# Patient Record
Sex: Male | Born: 1995 | Hispanic: Yes | Marital: Single
Health system: Southern US, Community
[De-identification: ages and names within clinical notes are randomized; demographics above are authoritative.]

---

## 2021-05-29 ENCOUNTER — Emergency Department (HOSPITAL_COMMUNITY): Payer: No Typology Code available for payment source

## 2021-05-29 ENCOUNTER — Emergency Department (HOSPITAL_COMMUNITY)
Admission: EM | Admit: 2021-05-29 | Discharge: 2021-05-29 | Disposition: A | Discharge status: Home / Self Care | Payer: No Typology Code available for payment source | Attending: Emergency Medicine | Admitting: Emergency Medicine

## 2021-05-29 DIAGNOSIS — M25562 Pain in left knee: Secondary | ICD-10-CM | POA: Diagnosis not present

## 2021-05-29 DIAGNOSIS — Y92410 Unspecified street and highway as the place of occurrence of the external cause: Secondary | ICD-10-CM | POA: Insufficient documentation

## 2021-05-29 DIAGNOSIS — Z79899 Other long term (current) drug therapy: Secondary | ICD-10-CM | POA: Insufficient documentation

## 2021-05-29 DIAGNOSIS — S81811A Laceration without foreign body, right lower leg, initial encounter: Secondary | ICD-10-CM | POA: Insufficient documentation

## 2021-05-29 DIAGNOSIS — R109 Unspecified abdominal pain: Secondary | ICD-10-CM | POA: Diagnosis not present

## 2021-05-29 DIAGNOSIS — Z20822 Contact with and (suspected) exposure to covid-19: Secondary | ICD-10-CM | POA: Diagnosis not present

## 2021-05-29 DIAGNOSIS — S8991XA Unspecified injury of right lower leg, initial encounter: Secondary | ICD-10-CM | POA: Diagnosis present

## 2021-05-29 DIAGNOSIS — M25572 Pain in left ankle and joints of left foot: Secondary | ICD-10-CM | POA: Diagnosis not present

## 2021-05-29 DIAGNOSIS — M545 Low back pain, unspecified: Secondary | ICD-10-CM | POA: Insufficient documentation

## 2021-05-29 DIAGNOSIS — S3991XA Unspecified injury of abdomen, initial encounter: Secondary | ICD-10-CM | POA: Diagnosis not present

## 2021-05-29 DIAGNOSIS — Z23 Encounter for immunization: Secondary | ICD-10-CM | POA: Insufficient documentation

## 2021-05-29 DIAGNOSIS — M79662 Pain in left lower leg: Secondary | ICD-10-CM | POA: Insufficient documentation

## 2021-05-29 LAB — SAMPLE TO BLOOD BANK

## 2021-05-29 LAB — CBC
HCT: 40.8 % (ref 39.0–52.0)
Hemoglobin: 13.9 g/dL (ref 13.0–17.0)
MCH: 31 pg (ref 26.0–34.0)
MCHC: 34.1 g/dL (ref 30.0–36.0)
MCV: 91.1 fL (ref 80.0–100.0)
Platelets: 220 10*3/uL (ref 150–400)
RBC: 4.48 MIL/uL (ref 4.22–5.81)
RDW: 11.9 % (ref 11.5–15.5)
WBC: 12.6 10*3/uL — ABNORMAL HIGH (ref 4.0–10.5)
nRBC: 0 % (ref 0.0–0.2)

## 2021-05-29 LAB — I-STAT CHEM 8, ED
BUN: 24 mg/dL — ABNORMAL HIGH (ref 6–20)
Calcium, Ion: 1.11 mmol/L — ABNORMAL LOW (ref 1.15–1.40)
Chloride: 105 mmol/L (ref 98–111)
Creatinine, Ser: 0.8 mg/dL (ref 0.61–1.24)
Glucose, Bld: 114 mg/dL — ABNORMAL HIGH (ref 70–99)
HCT: 41 % (ref 39.0–52.0)
Hemoglobin: 13.9 g/dL (ref 13.0–17.0)
Potassium: 3.6 mmol/L (ref 3.5–5.1)
Sodium: 139 mmol/L (ref 135–145)
TCO2: 24 mmol/L (ref 22–32)

## 2021-05-29 LAB — URINALYSIS, ROUTINE W REFLEX MICROSCOPIC
Bacteria, UA: NONE SEEN
Bilirubin Urine: NEGATIVE
Glucose, UA: NEGATIVE mg/dL
Ketones, ur: NEGATIVE mg/dL
Leukocytes,Ua: NEGATIVE
Nitrite: NEGATIVE
Protein, ur: NEGATIVE mg/dL
Specific Gravity, Urine: 1.028 (ref 1.005–1.030)
pH: 5 (ref 5.0–8.0)

## 2021-05-29 LAB — COMPREHENSIVE METABOLIC PANEL
ALT: 40 U/L (ref 0–44)
AST: 55 U/L — ABNORMAL HIGH (ref 15–41)
Albumin: 4 g/dL (ref 3.5–5.0)
Alkaline Phosphatase: 61 U/L (ref 38–126)
Anion gap: 7 (ref 5–15)
BUN: 23 mg/dL — ABNORMAL HIGH (ref 6–20)
CO2: 24 mmol/L (ref 22–32)
Calcium: 9.1 mg/dL (ref 8.9–10.3)
Chloride: 106 mmol/L (ref 98–111)
Creatinine, Ser: 0.81 mg/dL (ref 0.61–1.24)
GFR, Estimated: >60 mL/min (ref >60)
Glucose, Bld: 122 mg/dL — ABNORMAL HIGH (ref 70–99)
Potassium: 3.6 mmol/L (ref 3.5–5.1)
Sodium: 137 mmol/L (ref 135–145)
Total Bilirubin: 0.9 mg/dL (ref 0.3–1.2)
Total Protein: 7.2 g/dL (ref 6.5–8.1)

## 2021-05-29 LAB — PROTIME-INR
INR: 1 (ref 0.8–1.2)
Prothrombin Time: 12.9 seconds (ref 11.4–15.2)

## 2021-05-29 LAB — CK TOTAL AND CKMB (NOT AT ARMC)
CK, MB: 29.3 ng/mL — ABNORMAL HIGH (ref 0.5–5.0)
Relative Index: 2.7 — ABNORMAL HIGH (ref 0.0–2.5)
Total CK: 1075 U/L — ABNORMAL HIGH (ref 49–397)

## 2021-05-29 LAB — ETHANOL: Alcohol, Ethyl (B): <10 mg/dL (ref <10)

## 2021-05-29 LAB — LACTIC ACID, PLASMA: Lactic Acid, Venous: 1.1 mmol/L (ref 0.5–1.9)

## 2021-05-29 LAB — RESP PANEL BY RT-PCR (FLU A&B, COVID) ARPGX2
Influenza A by PCR: NEGATIVE
Influenza B by PCR: NEGATIVE
SARS Coronavirus 2 by RT PCR: NEGATIVE

## 2021-05-29 MED ORDER — METHOCARBAMOL 500 MG PO TABS: 500.0000 mg | ORAL_TABLET | Freq: Three times a day (TID) | ORAL | 0 refills | Status: AC | PRN

## 2021-05-29 MED ORDER — LIDOCAINE-EPINEPHRINE (PF) 2 %-1:200000 IJ SOLN
20.0000 mL | Freq: Once | INTRAMUSCULAR | Status: DC
  Filled 2021-05-29: qty 20

## 2021-05-29 MED ORDER — FENTANYL CITRATE (PF) 100 MCG/2ML IJ SOLN
75.0000 ug | Freq: Once | INTRAMUSCULAR | Status: AC
  Administered 2021-05-29: 75 ug via INTRAVENOUS
  Filled 2021-05-29: qty 2

## 2021-05-29 MED ORDER — IOHEXOL 300 MG/ML  SOLN
100.0000 mL | Freq: Once | INTRAMUSCULAR | Status: AC | PRN
  Administered 2021-05-29: 100 mL via INTRAVENOUS

## 2021-05-29 MED ORDER — ONDANSETRON HCL 4 MG/2ML IJ SOLN
4.0000 mg | Freq: Once | INTRAMUSCULAR | Status: AC
  Administered 2021-05-29: 4 mg via INTRAVENOUS
  Filled 2021-05-29: qty 2

## 2021-05-29 MED ORDER — TETANUS-DIPHTH-ACELL PERTUSSIS 5-2.5-18.5 LF-MCG/0.5 IM SUSY
0.5000 mL | PREFILLED_SYRINGE | Freq: Once | INTRAMUSCULAR | Status: AC
  Administered 2021-05-29: 0.5 mL via INTRAMUSCULAR
  Filled 2021-05-29: qty 0.5

## 2021-05-29 MED ORDER — OXYCODONE-ACETAMINOPHEN 5-325 MG PO TABS: 1.0000 | ORAL_TABLET | Freq: Four times a day (QID) | ORAL | 0 refills | Status: AC | PRN

## 2021-05-29 MED ORDER — OXYCODONE-ACETAMINOPHEN 5-325 MG PO TABS
1.0000 | ORAL_TABLET | Freq: Once | ORAL | Status: AC
  Administered 2021-05-29: 1 via ORAL
  Filled 2021-05-29: qty 1

## 2021-05-29 MED ORDER — SODIUM CHLORIDE 0.9 % IV BOLUS
1000.0000 mL | Freq: Once | INTRAVENOUS | Status: AC
  Administered 2021-05-29: 1000 mL via INTRAVENOUS

## 2021-05-29 MED ORDER — FENTANYL CITRATE (PF) 100 MCG/2ML IJ SOLN
50.0000 ug | Freq: Once | INTRAMUSCULAR | Status: AC
  Administered 2021-05-29: 50 ug via INTRAVENOUS
  Filled 2021-05-29: qty 2
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
## 2021-05-29 NOTE — Discharge Instructions (Addendum)
Thank you for allowing me to care for you today in the Emergency Department.   Your staples need to be removed in 8 to 10 days.  These can be removed by following up with your primary care provider, going to urgent care, or returning to the emergency department.  Keep the wound clean and dry for the next 24 hours.  Then, you can clean the wound daily with warm water and soap.  You can keep the wound covered with a dressing if you prefer.  Your tetanus immunization was updated in the emergency department today.  Take 650 mg of Tylenol or 600 mg of ibuprofen with food every 6 hours for pain.  You can alternate between these 2 medications every 3 hours if your pain returns.  For instance, you can take Tylenol at noon, followed by a dose of ibuprofen at 3, followed by second dose of Tylenol and 6.  For severe, uncontrollable pain you can take 1 tablet of Percocet every 6 hours as needed.  Do not drive or work with this medication as it can cause you to be impaired.  You should not take it with other sedating substances.  Each tablet of Percocet contains 325 mg of Tylenol.  You should not use more than 4000 mg of Tylenol from all sources in a 24-hour period.  Today you were prescribed Methocarbamol (Robaxin).  Methocarbamol (Robaxin) is used to treat muscle spasms/pain.  It works by helping to relax the muscles.  Drowsiness, dizziness, lightheadedness, stomach upset, nausea/vomiting, or blurred vision may occur.  Do not drive, use machinery, or do anything that needs alertness or clear vision until you can do it safely.  Do not combine this medication with alcoholic beverages, marijuana, or other central nervous system depressants.    Return to the emergency department if you develop new numbness, weakness, uncontrollable vomiting, severe, uncontrollable chest or abdominal pain, or other new, concerning symptoms.

## 2021-05-29 NOTE — ED Notes (Signed)
Patient transported to CT 
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
## 2021-05-29 NOTE — ED Notes (Addendum)
PT transported to Xray

## 2021-05-29 NOTE — Progress Notes (Signed)
Orthopedic Tech Progress Note Patient Details:  Randall Burns 01-11-1996 [id number]  Ortho Devices Type of Ortho Device: Knee Immobilizer, Crutches Ortho Device/Splint Location: Left Knee Ortho Device/Splint Interventions: Application, Adjustment   Post Interventions Patient Tolerated: Well Instructions Provided: Adjustment of device  Darienne Belleau E Clarann Helvey 05/29/2021, 9:26 AM
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
